# Patient Record
Sex: Male | Born: 1960 | Race: White | Hispanic: No
Health system: Southern US, Community
[De-identification: ages and names within clinical notes are randomized; demographics above are authoritative.]

---

## 2004-12-30 ENCOUNTER — Emergency Department: Payer: Self-pay | Admitting: General Practice

## 2005-03-30 ENCOUNTER — Emergency Department: Payer: Self-pay | Admitting: Emergency Medicine

## 2005-04-03 ENCOUNTER — Emergency Department: Payer: Self-pay | Admitting: Unknown Physician Specialty

## 2005-04-07 ENCOUNTER — Emergency Department: Payer: Self-pay | Admitting: Unknown Physician Specialty

## 2005-04-09 ENCOUNTER — Ambulatory Visit: Payer: Self-pay

## 2005-05-09 ENCOUNTER — Emergency Department: Payer: Self-pay | Admitting: Emergency Medicine

## 2005-08-19 ENCOUNTER — Inpatient Hospital Stay: Payer: Self-pay | Admitting: Urology

## 2005-09-07 ENCOUNTER — Emergency Department: Payer: Self-pay | Admitting: Emergency Medicine

## 2005-09-09 ENCOUNTER — Emergency Department: Payer: Self-pay | Admitting: Emergency Medicine

## 2005-09-19 ENCOUNTER — Emergency Department: Payer: Self-pay | Admitting: Emergency Medicine

## 2005-09-27 ENCOUNTER — Emergency Department: Payer: Self-pay | Admitting: Unknown Physician Specialty

## 2006-01-03 ENCOUNTER — Emergency Department: Payer: Self-pay | Admitting: Emergency Medicine

## 2006-05-02 ENCOUNTER — Emergency Department: Payer: Self-pay | Admitting: Emergency Medicine

## 2007-04-05 ENCOUNTER — Emergency Department: Payer: Self-pay | Admitting: Emergency Medicine

## 2007-05-07 ENCOUNTER — Emergency Department: Payer: Self-pay | Admitting: Emergency Medicine

## 2007-05-28 ENCOUNTER — Emergency Department: Payer: Self-pay | Admitting: Emergency Medicine

## 2007-06-17 ENCOUNTER — Emergency Department: Payer: Self-pay | Admitting: General Practice

## 2007-06-21 ENCOUNTER — Emergency Department: Payer: Self-pay | Admitting: Emergency Medicine

## 2007-07-12 ENCOUNTER — Emergency Department: Payer: Self-pay | Admitting: Emergency Medicine

## 2007-07-17 ENCOUNTER — Emergency Department: Payer: Self-pay | Admitting: Emergency Medicine

## 2007-07-29 ENCOUNTER — Emergency Department: Payer: Self-pay | Admitting: Emergency Medicine

## 2007-07-29 ENCOUNTER — Other Ambulatory Visit: Payer: Self-pay

## 2007-08-08 ENCOUNTER — Emergency Department: Payer: Self-pay | Admitting: Emergency Medicine

## 2007-09-04 ENCOUNTER — Emergency Department: Payer: Self-pay | Admitting: Emergency Medicine

## 2007-09-10 ENCOUNTER — Emergency Department: Payer: Self-pay | Admitting: Emergency Medicine

## 2007-10-01 ENCOUNTER — Emergency Department: Payer: Self-pay

## 2007-10-06 ENCOUNTER — Emergency Department (HOSPITAL_COMMUNITY): Admission: EM | Admit: 2007-10-06 | Discharge: 2007-10-06 | Payer: Self-pay | Admitting: Emergency Medicine

## 2007-10-12 ENCOUNTER — Emergency Department: Payer: Self-pay | Admitting: Internal Medicine

## 2007-10-16 ENCOUNTER — Emergency Department: Payer: Self-pay | Admitting: Emergency Medicine

## 2008-03-10 ENCOUNTER — Emergency Department: Payer: Self-pay | Admitting: Unknown Physician Specialty

## 2008-04-06 ENCOUNTER — Emergency Department: Payer: Self-pay | Admitting: Emergency Medicine

## 2008-05-24 ENCOUNTER — Emergency Department: Payer: Self-pay | Admitting: Emergency Medicine

## 2008-05-24 ENCOUNTER — Other Ambulatory Visit: Payer: Self-pay

## 2008-10-18 IMAGING — CT CT HEAD WITHOUT CONTRAST
2 series · 16 of 30 positions shown, 20 images · non-contrast
Comparison: none

REASON FOR EXAM: ataxia, rm 4
COMMENTS:

PROCEDURE:     CT  - CT HEAD WITHOUT CONTRAST  - July 29, 2007  [DATE]
RESULT:     Comparison: No available comparison exam.
Procedure: CT examination of the head was performed without intravenous
contrast. Collimation is 5 mm.

[Series 2: without · axial · non-contrast · 0.40mm/px · z∈[-97,+33]mm · 13 of 32 slices shown, 17 images]
[im 3/32  brain]
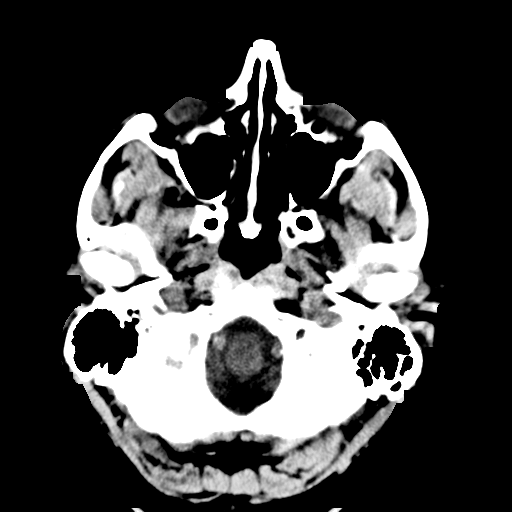
[im 3/32  bone]
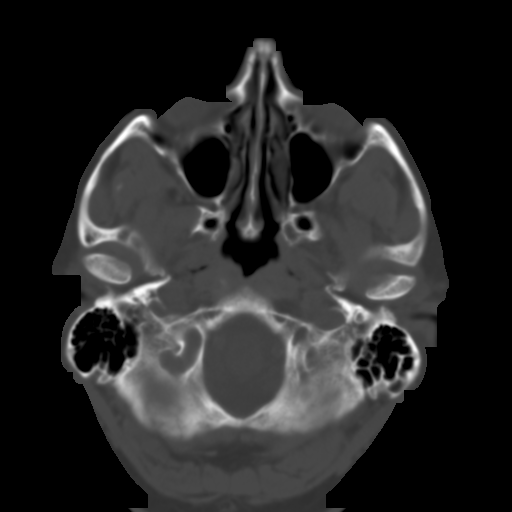
[im 5/32  brain]
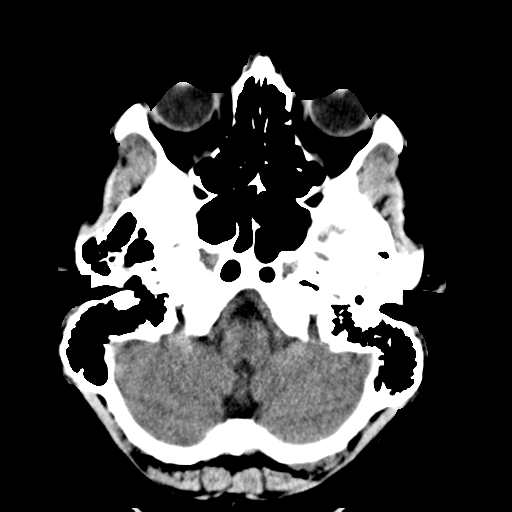
[im 7/32  brain]
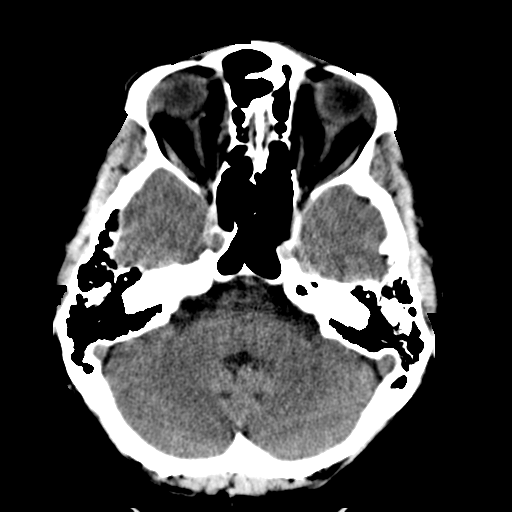
[im 9/32  brain]
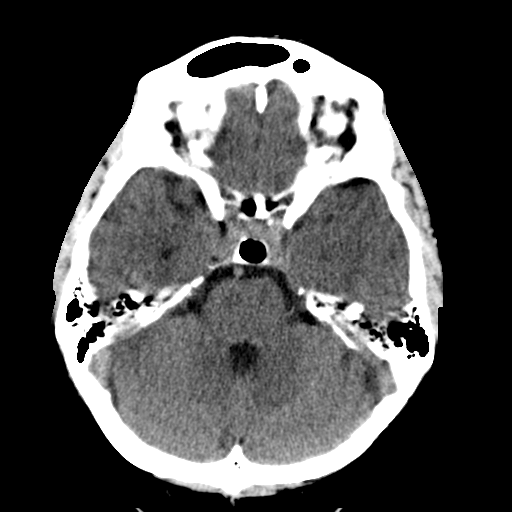
[im 12/32  brain]
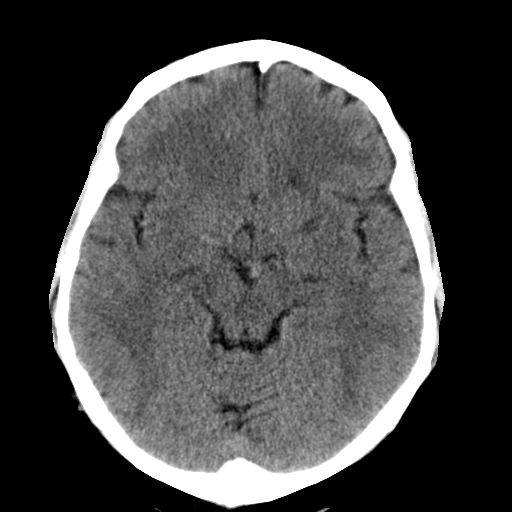
[im 12/32  bone]
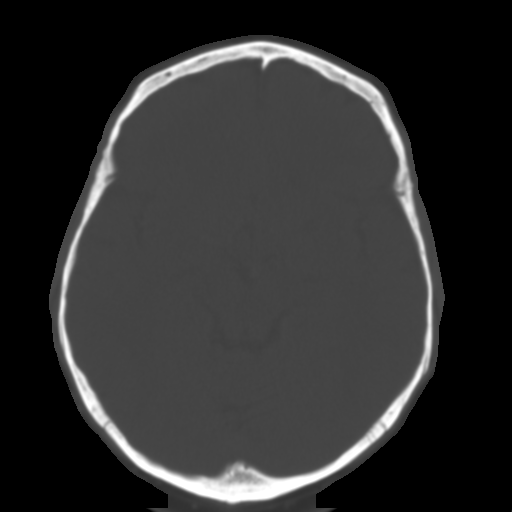
[im 14/32  brain]
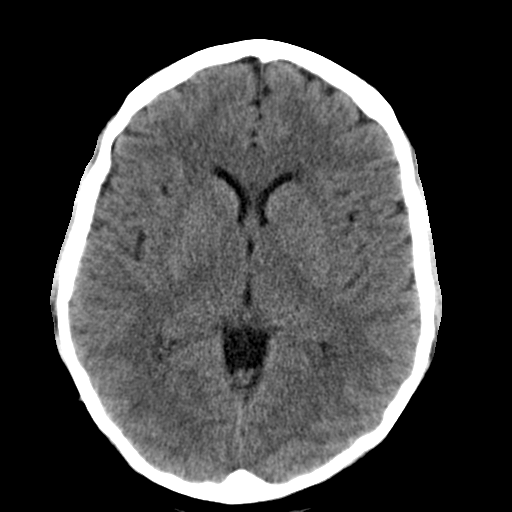
[im 16/32  brain]
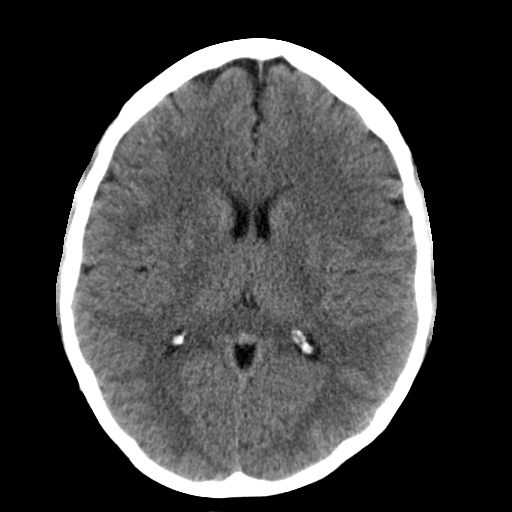
[im 18/32  brain]
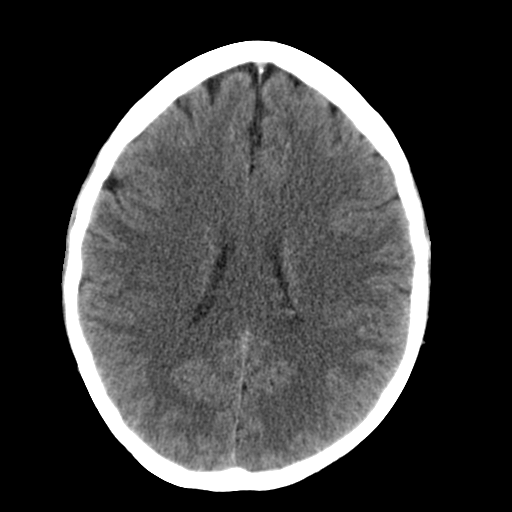
[im 20/32  brain]
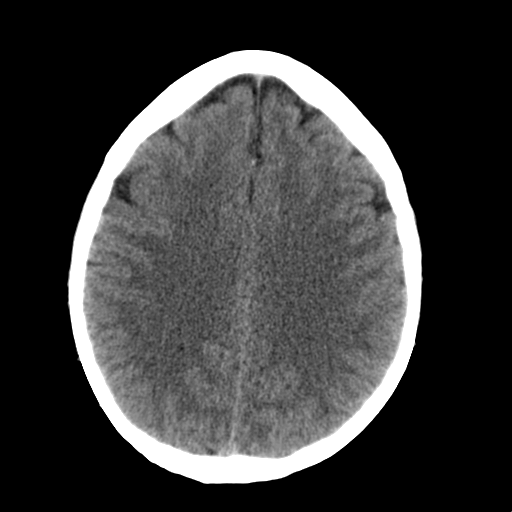
[im 20/32  bone]
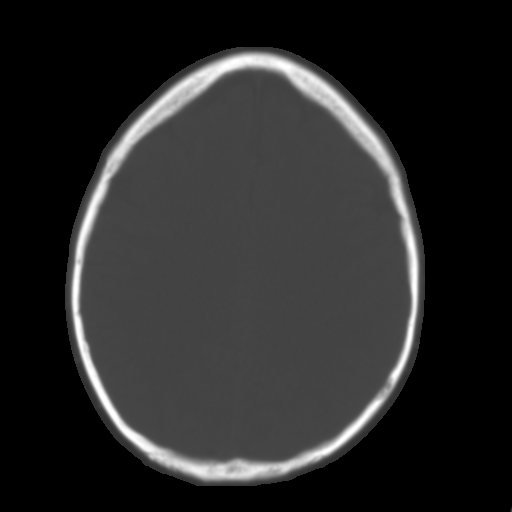
[im 23/32  brain]
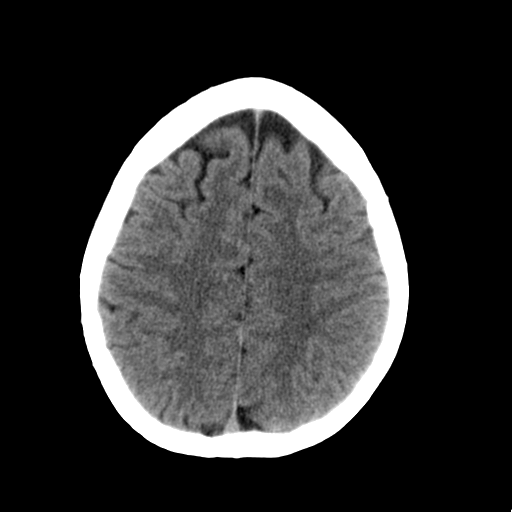
[im 25/32  brain]
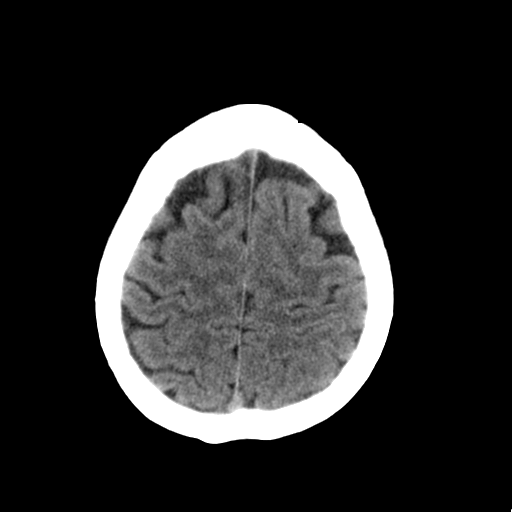
[im 27/32  brain]
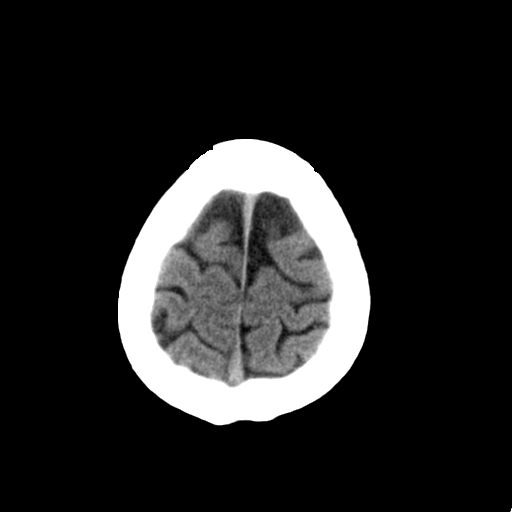
[im 29/32  brain]
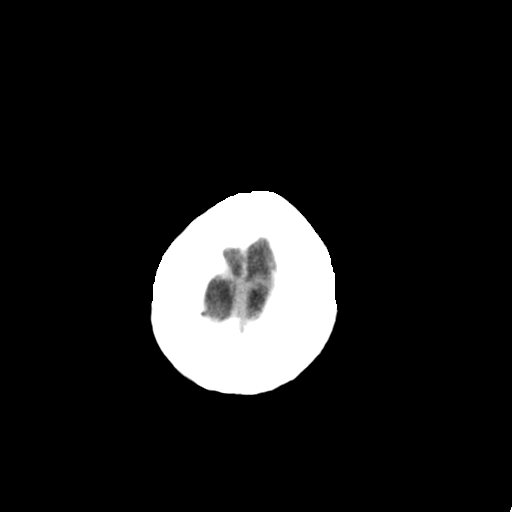
[im 29/32  bone]
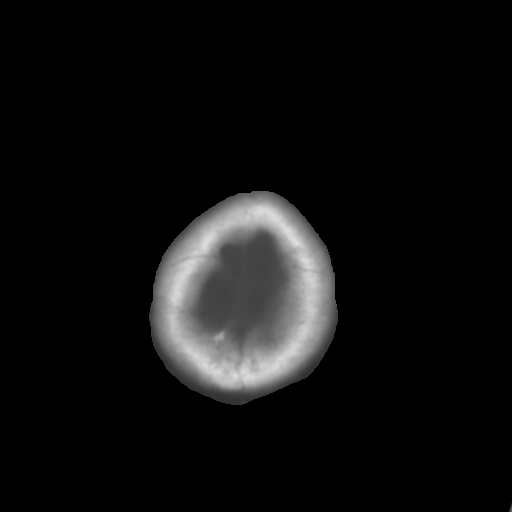

[Series 3: bone · axial · 0.40mm/px · z∈[-97,-52]mm · 3 of 32 slices shown]
[im 3/32  bone]
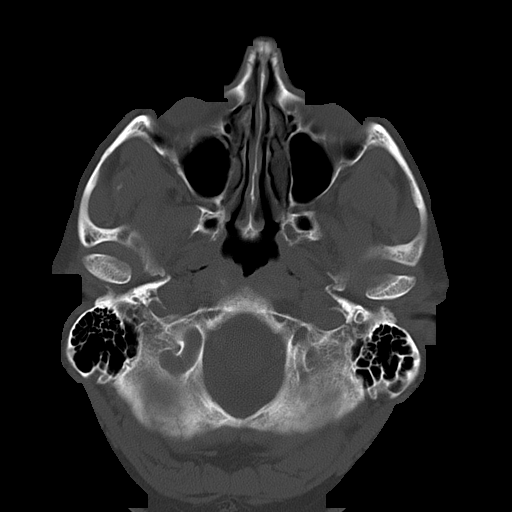
[im 7/32  bone]
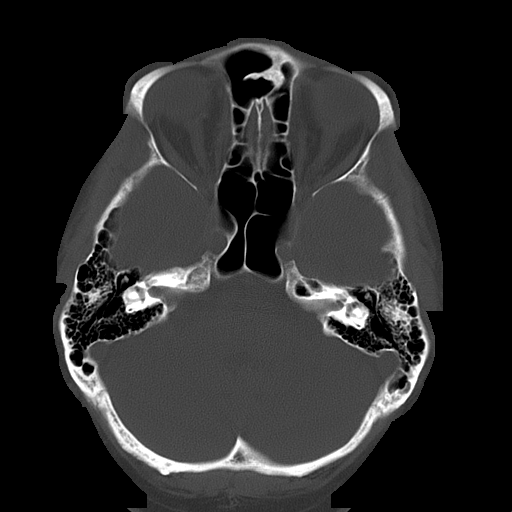
[im 12/32  bone]
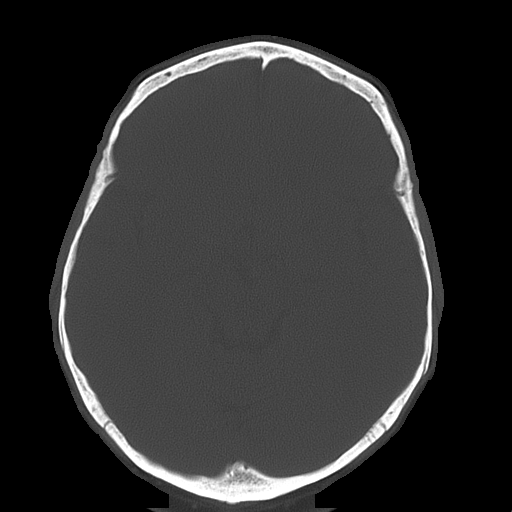

[16 of 30 positions shown; findings below may reference images not displayed]

FINDINGS: No evidence of intracranial hemorrhage, mass-effect, or ventricular
dilatation. Minimal left in cerebral white matter low attenuations are
nonspecific, but are most commonly due to minimal small vessel disease. No
displaced calvarial fracture is noted. The visualized paranasal sinuses and
mastoid air cells are unremarkable.
IMPRESSION: 1. Minimal left in cerebral white matter low attenuations are nonspecific,
but are most commonly due to minimal small vessel disease.

## 2010-11-22 ENCOUNTER — Inpatient Hospital Stay: Payer: Self-pay | Admitting: Urology

## 2010-12-20 ENCOUNTER — Emergency Department: Payer: Self-pay | Admitting: Emergency Medicine

## 2010-12-24 ENCOUNTER — Emergency Department: Payer: Self-pay | Admitting: Emergency Medicine

## 2010-12-30 ENCOUNTER — Ambulatory Visit: Payer: Self-pay | Admitting: Specialist

## 2011-03-09 ENCOUNTER — Inpatient Hospital Stay: Payer: Self-pay | Admitting: Unknown Physician Specialty

## 2011-05-02 ENCOUNTER — Inpatient Hospital Stay: Payer: Self-pay | Admitting: Internal Medicine

## 2011-05-05 ENCOUNTER — Inpatient Hospital Stay: Payer: Self-pay | Admitting: Unknown Physician Specialty

## 2011-05-28 ENCOUNTER — Inpatient Hospital Stay: Payer: Self-pay | Admitting: Vascular Surgery

## 2011-05-30 ENCOUNTER — Inpatient Hospital Stay: Payer: Self-pay | Admitting: Psychiatry

## 2011-07-06 ENCOUNTER — Emergency Department: Payer: Self-pay | Admitting: Unknown Physician Specialty

## 2011-08-07 ENCOUNTER — Inpatient Hospital Stay: Payer: Self-pay | Admitting: Psychiatry

## 2011-11-11 ENCOUNTER — Emergency Department: Payer: Self-pay | Admitting: *Deleted

## 2012-01-18 LAB — CBC
HCT: 46.3 % (ref 40.0–52.0)
MCH: 31.4 pg (ref 26.0–34.0)
MCHC: 34.6 g/dL (ref 32.0–36.0)
MCV: 91 fL (ref 80–100)
Platelet: 127 10*3/uL — ABNORMAL LOW (ref 150–440)
RDW: 13.5 % (ref 11.5–14.5)
WBC: 5.8 10*3/uL (ref 3.8–10.6)

## 2012-01-18 LAB — CK TOTAL AND CKMB (NOT AT ARMC)
CK, Total: 59 U/L (ref 35–232)
CK, Total: 66 U/L (ref 35–232)
CK-MB: <0.5 ng/mL — ABNORMAL LOW (ref 0.5–3.6)
CK-MB: <0.5 ng/mL — ABNORMAL LOW (ref 0.5–3.6)

## 2012-01-18 LAB — BASIC METABOLIC PANEL
Calcium, Total: 9.1 mg/dL (ref 8.5–10.1)
Chloride: 101 mmol/L (ref 98–107)
Creatinine: 1.85 mg/dL — ABNORMAL HIGH (ref 0.60–1.30)
EGFR (African American): 50 — ABNORMAL LOW
EGFR (Non-African Amer.): 41 — ABNORMAL LOW
Glucose: 106 mg/dL — ABNORMAL HIGH (ref 65–99)
Osmolality: 275 (ref 275–301)

## 2012-01-18 LAB — TROPONIN I
Troponin-I: <0.02 ng/mL
Troponin-I: <0.02 ng/mL

## 2012-01-19 ENCOUNTER — Inpatient Hospital Stay: Payer: Self-pay | Admitting: Family Medicine

## 2012-01-19 LAB — BASIC METABOLIC PANEL
Anion Gap: 8 (ref 7–16)
BUN: 21 mg/dL — ABNORMAL HIGH (ref 7–18)
Chloride: 103 mmol/L (ref 98–107)
Creatinine: 1.58 mg/dL — ABNORMAL HIGH (ref 0.60–1.30)
EGFR (Non-African Amer.): 49 — ABNORMAL LOW
Osmolality: 279 (ref 275–301)

## 2012-01-19 LAB — CBC WITH DIFFERENTIAL/PLATELET
Basophil %: 0.8 %
Eosinophil #: 0.1 10*3/uL (ref 0.0–0.7)
HGB: 13.3 g/dL (ref 13.0–18.0)
MCH: 30.9 pg (ref 26.0–34.0)
MCHC: 34.1 g/dL (ref 32.0–36.0)
Monocyte #: 0.3 10*3/uL (ref 0.0–0.7)
Neutrophil #: 2.1 10*3/uL (ref 1.4–6.5)
Neutrophil %: 52.5 %

## 2012-01-19 LAB — CK TOTAL AND CKMB (NOT AT ARMC): CK-MB: <0.5 ng/mL — ABNORMAL LOW (ref 0.5–3.6)

## 2012-01-20 LAB — BASIC METABOLIC PANEL
Anion Gap: 8 (ref 7–16)
BUN: 12 mg/dL (ref 7–18)
Calcium, Total: 8.8 mg/dL (ref 8.5–10.1)
Co2: 28 mmol/L (ref 21–32)
EGFR (African American): >60
EGFR (Non-African Amer.): 58 — ABNORMAL LOW
Sodium: 140 mmol/L (ref 136–145)

## 2012-02-08 ENCOUNTER — Emergency Department: Payer: Self-pay | Admitting: Emergency Medicine

## 2012-02-08 LAB — DRUG SCREEN, URINE
Amphetamines, Ur Screen: NEGATIVE (ref ?–1000)
Barbiturates, Ur Screen: NEGATIVE (ref ?–200)
Benzodiazepine, Ur Scrn: NEGATIVE (ref ?–200)
Cannabinoid 50 Ng, Ur ~~LOC~~: POSITIVE (ref ?–50)
Cocaine Metabolite,Ur ~~LOC~~: NEGATIVE (ref ?–300)
MDMA (Ecstasy)Ur Screen: POSITIVE (ref ?–500)
Methadone, Ur Screen: NEGATIVE (ref ?–300)
Opiate, Ur Screen: NEGATIVE (ref ?–300)
Tricyclic, Ur Screen: POSITIVE (ref ?–1000)

## 2012-02-08 LAB — URINALYSIS, COMPLETE
Bacteria: NONE SEEN
Leukocyte Esterase: NEGATIVE
Nitrite: NEGATIVE
Ph: 6 (ref 4.5–8.0)
Protein: NEGATIVE
Specific Gravity: 1.012 (ref 1.003–1.030)
WBC UR: 1 /HPF (ref 0–5)

## 2012-02-08 LAB — COMPREHENSIVE METABOLIC PANEL
Albumin: 4.7 g/dL (ref 3.4–5.0)
Alkaline Phosphatase: 93 U/L (ref 50–136)
BUN: 12 mg/dL (ref 7–18)
Bilirubin,Total: 0.6 mg/dL (ref 0.2–1.0)
Chloride: 102 mmol/L (ref 98–107)
Co2: 25 mmol/L (ref 21–32)
EGFR (African American): >60
EGFR (Non-African Amer.): 52 — ABNORMAL LOW
Glucose: 111 mg/dL — ABNORMAL HIGH (ref 65–99)
SGOT(AST): 12 U/L — ABNORMAL LOW (ref 15–37)
SGPT (ALT): 17 U/L
Sodium: 133 mmol/L — ABNORMAL LOW (ref 136–145)
Total Protein: 8.5 g/dL — ABNORMAL HIGH (ref 6.4–8.2)

## 2012-02-08 LAB — TSH: Thyroid Stimulating Horm: 1.93 u[IU]/mL

## 2012-02-08 LAB — CBC
HGB: 16.2 g/dL (ref 13.0–18.0)
MCH: 31 pg (ref 26.0–34.0)
MCHC: 34.2 g/dL (ref 32.0–36.0)
RDW: 14.1 % (ref 11.5–14.5)

## 2012-02-08 LAB — ETHANOL: Ethanol %: <0.003 % (ref 0.000–0.080)

## 2012-02-08 LAB — SALICYLATE LEVEL: Salicylates, Serum: 5.3 mg/dL — ABNORMAL HIGH

## 2012-02-08 LAB — ACETAMINOPHEN LEVEL: Acetaminophen: <2 ug/mL

## 2012-05-18 LAB — COMPREHENSIVE METABOLIC PANEL
Anion Gap: 9 (ref 7–16)
Bilirubin,Total: 0.8 mg/dL (ref 0.2–1.0)
Calcium, Total: 9 mg/dL (ref 8.5–10.1)
Chloride: 100 mmol/L (ref 98–107)
EGFR (African American): >60
Osmolality: 271 (ref 275–301)
Potassium: 3.8 mmol/L (ref 3.5–5.1)
Sodium: 133 mmol/L — ABNORMAL LOW (ref 136–145)
Total Protein: 8.1 g/dL (ref 6.4–8.2)

## 2012-05-18 LAB — URINALYSIS, COMPLETE
Glucose,UR: NEGATIVE mg/dL (ref 0–75)
Protein: 30
RBC,UR: 5 /HPF (ref 0–5)
Specific Gravity: 1.013 (ref 1.003–1.030)
Squamous Epithelial: 1

## 2012-05-18 LAB — CBC
MCH: 30.8 pg (ref 26.0–34.0)
MCHC: 35 g/dL (ref 32.0–36.0)
MCV: 88 fL (ref 80–100)
RDW: 14 % (ref 11.5–14.5)

## 2012-05-18 LAB — DRUG SCREEN, URINE
Amphetamines, Ur Screen: NEGATIVE (ref ?–1000)
Benzodiazepine, Ur Scrn: NEGATIVE (ref ?–200)
MDMA (Ecstasy)Ur Screen: POSITIVE (ref ?–500)
Methadone, Ur Screen: NEGATIVE (ref ?–300)
Opiate, Ur Screen: NEGATIVE (ref ?–300)
Phencyclidine (PCP) Ur S: NEGATIVE (ref ?–25)

## 2012-05-18 LAB — ETHANOL
Ethanol %: <0.003 % (ref 0.000–0.080)
Ethanol: <3 mg/dL

## 2012-05-18 LAB — SALICYLATE LEVEL: Salicylates, Serum: 4.2 mg/dL — ABNORMAL HIGH

## 2012-05-18 LAB — ACETAMINOPHEN LEVEL: Acetaminophen: <2 ug/mL

## 2012-05-19 ENCOUNTER — Inpatient Hospital Stay: Payer: Self-pay | Admitting: Psychiatry

## 2012-05-26 LAB — POTASSIUM: Potassium: 3.8 mmol/L (ref 3.5–5.1)

## 2013-01-31 IMAGING — CR DG SHOULDER 3+V*R*
1 series · 3 of 3 positions shown · non-contrast
Comparison: none

REASON FOR EXAM: shoulder  pain
COMMENTS:

PROCEDURE:     DXR - DXR SHOULDER RIGHT COMPLETE  - November 11, 2011 [DATE]
RESULT:     No fracture, dislocation or other acute bony abnormality is
identified. A metallic surgical anchor is again visualized over the right
humeral head and is unchanged in appearance as compared to the prior exam.

[Series 1: w shoulder external right · 0.14mm/px · 3 of 3 slices shown]
[im 1/3]
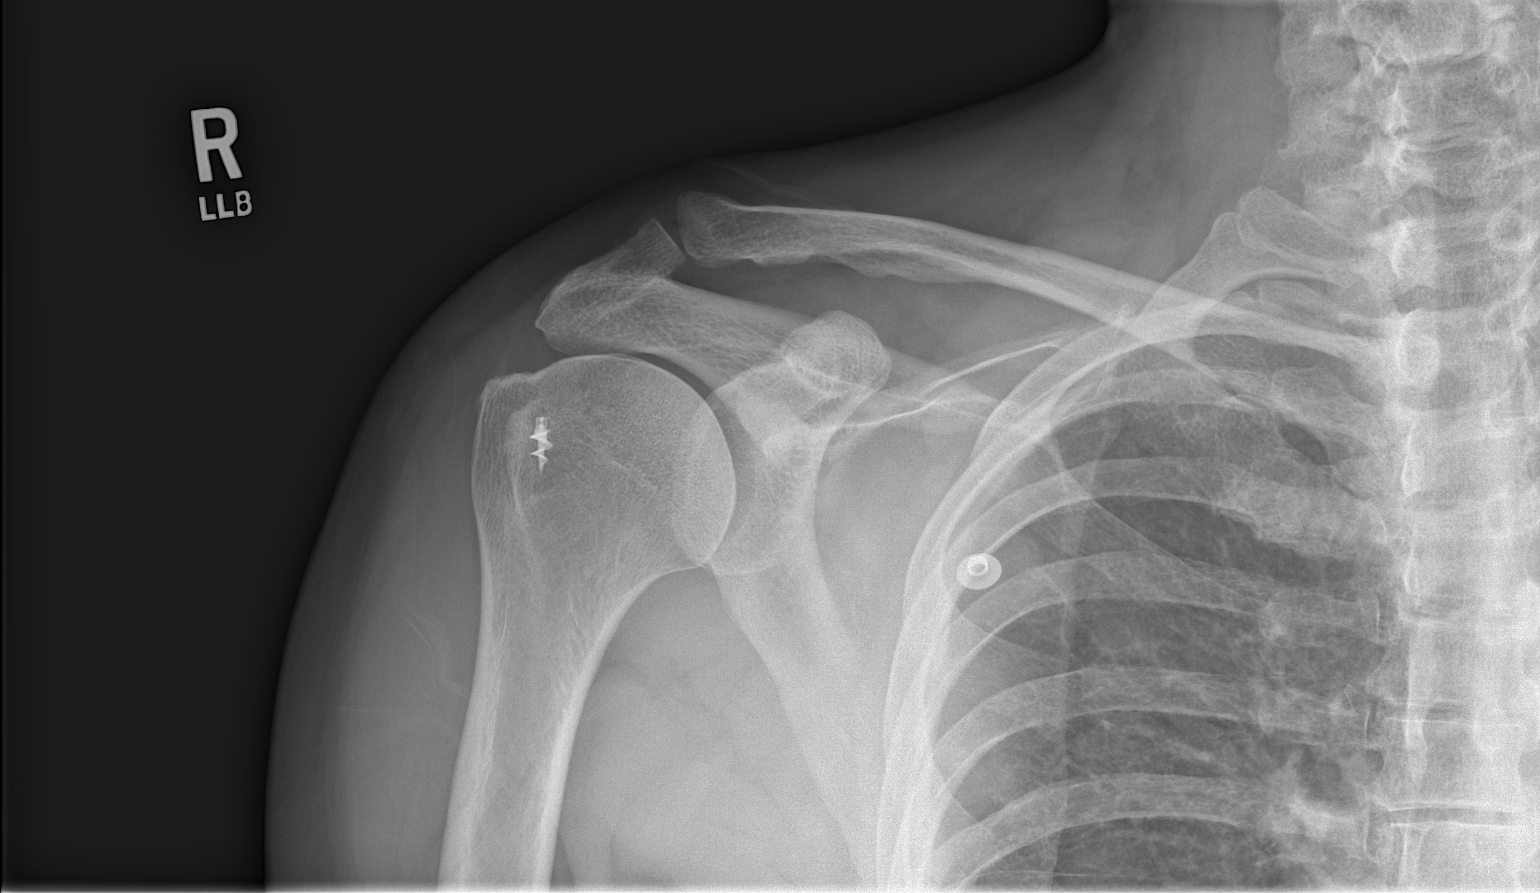
[im 2/3]
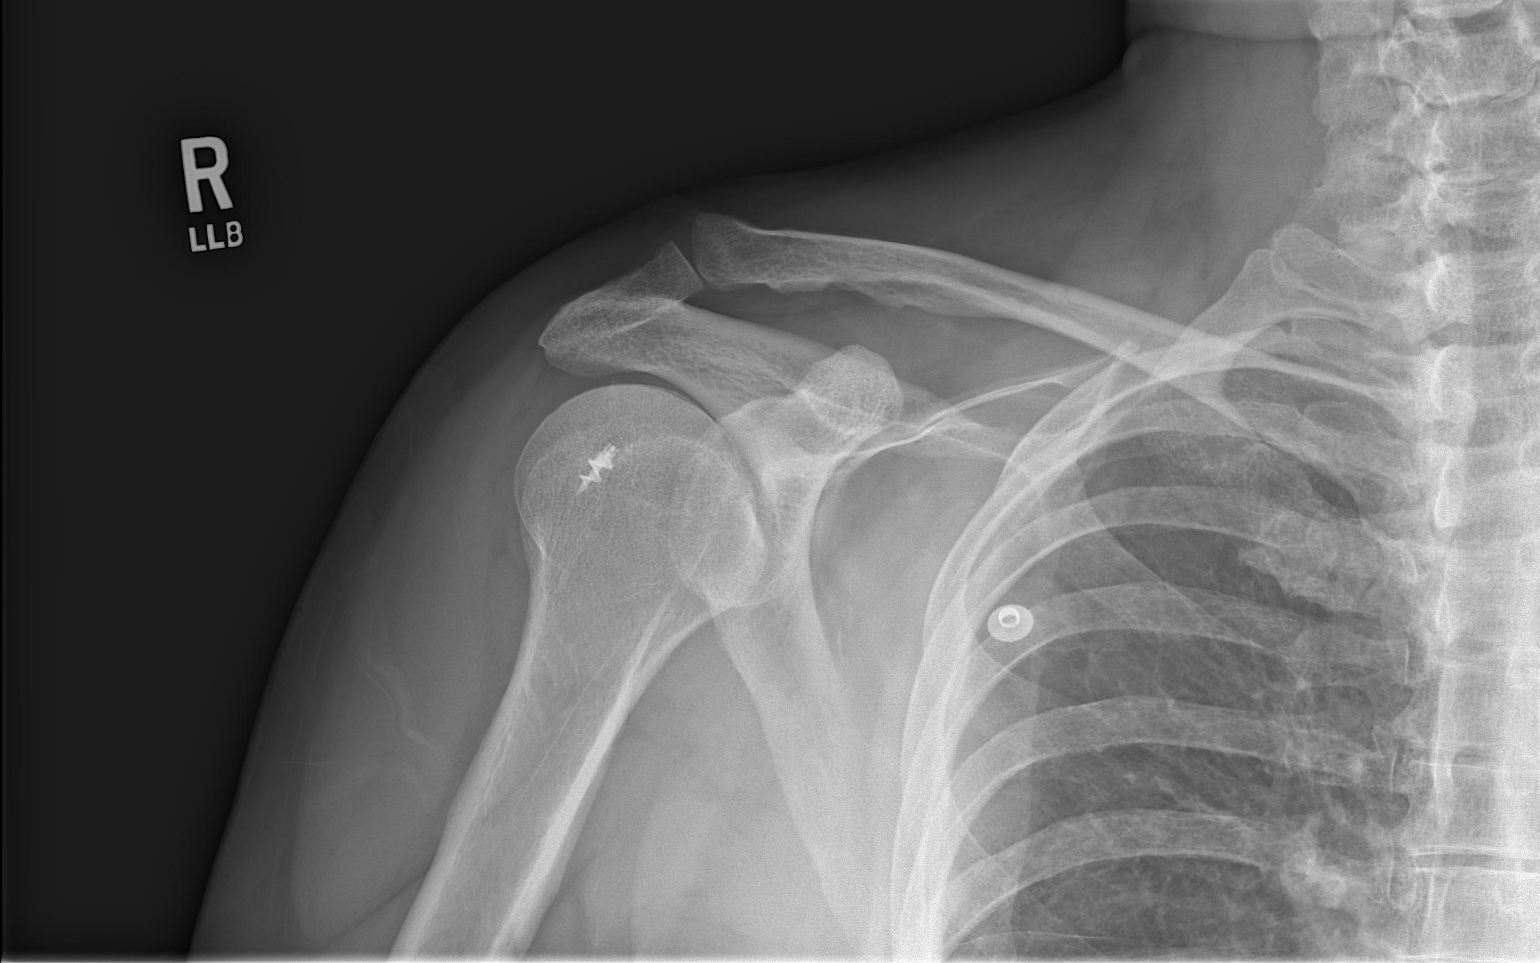
[im 3/3]
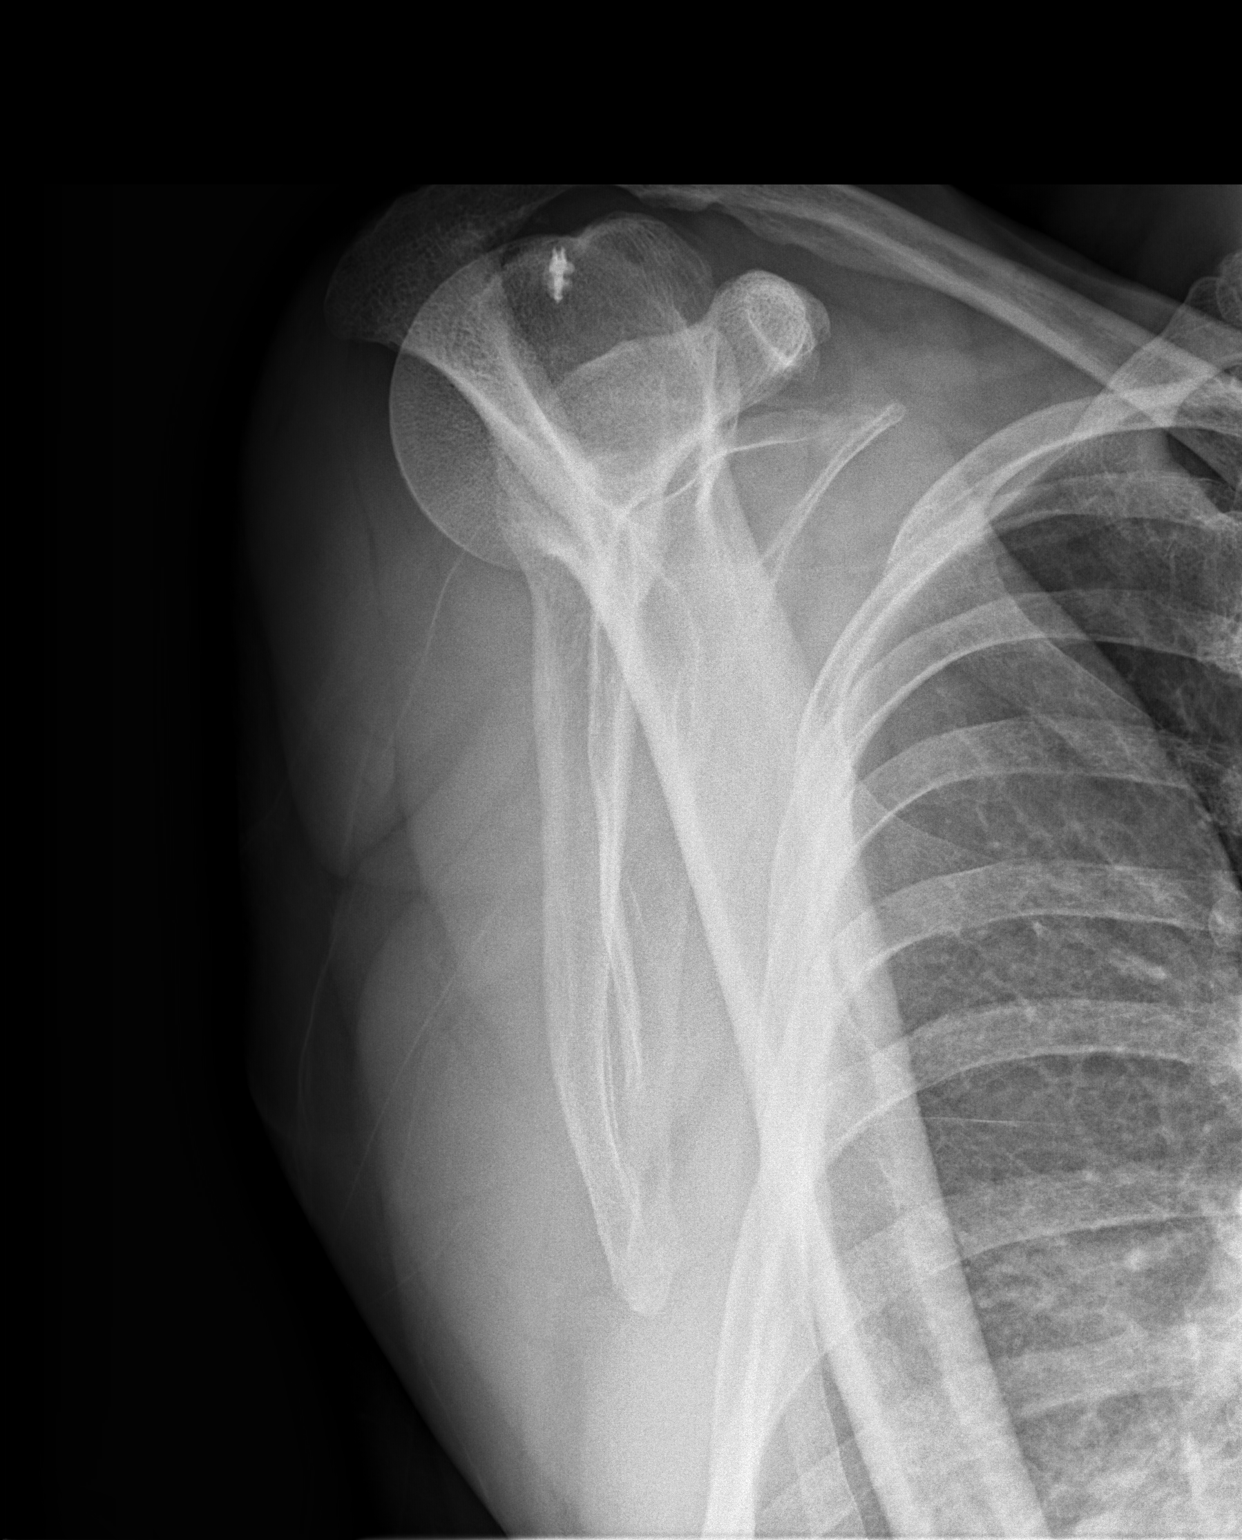

[3 of 3 positions shown; findings below may reference images not displayed]

IMPRESSION: No acute bony abnormalities are identified.

## 2015-04-14 NOTE — Discharge Summary (Signed)
PATIENT NAME:  Douglas Glover MR#:  [id number] DATE OF BIRTH:  [birth year]  DATE OF ADMISSION:  05/19/2012 DATE OF DISCHARGE:  05/31/2012  HISTORY OF PRESENT ILLNESS: [name] is Glover 54 year old male admitted to the [facility] with auditory hallucinations telling him to kill himself. He had various plans including using knives or crashing his car. He had gone through Glover several week period without his psychotropic medication and had only restarted them five days prior to admission.   He also had experienced Glover return of low energy, difficulty concentrating, and anhedonia. His memory and orientation function were intact.   ANCILLARY CLINICAL DATA: [name] was given his dosages of amlodipine and hydrochlorothiazide, however, his blood pressure continued to be high. He then remembered that he had been on atenolol. The atenolol was restarted and titrated to 50 mg daily with resolution of his hypertension.   He also has Glover history of lower extremity edema. He was given knee high socks bilaterally which had been prescribed for him as an outpatient by his primary care physician.   HOSPITAL COURSE: [name] was admitted to the [facility] and underwent milieu and group psychotherapy. He underwent Glover titration of his Seroquel back to 900 mg at bedtime for anti-psychosis and mood stabilization. He also underwent Glover restarting of his Remeron at 15 mg at bedtime along with Trazodone 400 mg at bedtime.   His mood progressively improved. His auditory hallucinations eventually resolved.   CONDITION ON DISCHARGE: By June 11th, [name] is socially appropriate. His mood is normal. His interests are normal. He has constructive future goals and interests. He is not having any auditory hallucinations. He is not showing any adverse medication effect.   MENTAL STATUS EXAM UPON DISCHARGE: [name] is alert. His eye contact is good. Concentration is normal. His memory is intact  to immediate, recent, and remote. His fund of knowledge, intelligence, and use of language are normal. His speech involves normal rate and prosody without dysarthria. Thought process is logical, coherent, and goal directed. No looseness of associations. Thought content no thoughts of harming himself or others. No delusions or hallucinations. Insight is intact. Judgment intact. Affect broad and appropriate. Mood within normal limits.   ASSESSMENT:  AXIS I:  1. Schizoaffective disorder, recent episode depressed with psychosis now in clinical remission.  2. Cannabis abuse.   AXIS II: Deferred.   AXIS III:  1. Lumbar disk disease. 2. Chronic back pain. 3. Hypertension. 4. Rule out lower extremity venous insufficiency.   AXIS IV: Primary support group.   AXIS V: 55.   [name] does not have any risk of harming himself or others. He agrees to call emergency services immediately for any thoughts of harming himself, thoughts of harming others, or distress.   DIET: Regular.   ACTIVITY: Routine.   FOLLOW-UP: He will make Glover follow-up appointment with his primary care [facility] within 10 days of discharge. He also has Glover psychiatric follow-up with [facility] on June 13th at 9 Glover.m.    DISCHARGE MEDICATIONS:  1. Doxepin 25 mg at bedtime, #10.  2. Remeron 15 mg at bedtime, #10.   3. Gabapentin 300 mg t.i.d., #30.  4. Hydrochlorothiazide 12.5 mg daily, #10.   5. Trazodone 100 mg 4 p.o. at bedtime, #40.   6. Tramadol 50 mg q.6 hours p.r.n. #20.   7. Norvasc 10 mg daily, #10.   8. Bisacodyl 10 mg at bedtime, #10.  9.  Quetiapine 100 mg 9 p.o. at bedtime, #45, refill x1.  10. Hydroxyzine 25 mg 1 to 2 t.i.d. p.r.n. anxiety and 1 to 3 p.o. at bedtime p.r.n. insomnia. 11. Atenolol 50 mg daily, #10.   ____________________________ Douglas Amas. Kmya Placide, MD jsw:drc D: 05/31/2012 20:49:20 ET T: 06/01/2012 06:14:25 ET JOB#: [id number]  cc: Douglas Amas. Douglas Strohm, MD, <Dictator> Douglas [place] MD ELECTRONICALLY SIGNED 06/04/2012 10:24

## 2015-04-14 NOTE — H&P (Signed)
PATIENT NAME:  Douglas Glover, Douglas Glover MR#:  [id number] DATE OF BIRTH:  07/10/1961  DATE OF ADMISSION:  01/18/2012  REFERRING PHYSICIAN: Dr. Carollee Massed   FAMILY PHYSICIAN: Dr. Burnadette Pop   REASON FOR ADMISSION: Recurrent chest pain with near syncope.   HISTORY OF PRESENT ILLNESS: The patient is a 54 year old male with a history of insignificant coronary artery disease by recent cardiac catheterization as well as benign hypertension, chronic pain, and history of psychosis who presents to the Emergency Room with recurrent episodes of chest pain as well as recurrent episodes of near syncope when he stands up or walks. In the Emergency Room, initial enzymes were negative and his EKG was unremarkable. CT was negative for any type of aneurysmal aortic issues. Blood pressure was low in the Emergency Room. He attributes his symptoms to recent change in medications. He is now admitted for further evaluation.   PAST MEDICAL HISTORY:  1. Major depression with multiple suicide attempts.  2. Insignificant coronary artery disease by cardiac catheterization. 3. Benign hypertension.  4. Migraine headaches. 5. Nephrolithiasis.  6. Chronic back pain.  7. Degenerative disk disease.  8. History of psychosis.  9. Chronic insomnia.   MEDICATIONS:  1. Atenolol 100 mg p.o. daily.  2. Lisinopril 20 mg p.o. daily.  3. Hydrochlorothiazide 25 mg p.o. daily.  4. Trazodone 400 mg p.o. at bedtime.  5. Seroquel 1000 mg p.o. daily.   ALLERGIES: Tetracycline, Toradol, and Ambien.   SOCIAL HISTORY: The patient is divorced and lives by himself. Smokes 3 to 4 cigarettes per day. Has a history of alcohol abuse but none recently.   FAMILY HISTORY: Unknown. The patient is adopted.   REVIEW OF SYSTEMS: As per history of present illness.   PHYSICAL EXAMINATION:   GENERAL: The patient is in no acute distress.   VITAL SIGNS: Vital signs are currently remarkable for a blood pressure of 98/62 with a heart rate of 74 and a  respiratory rate of 18. He is afebrile.   HEENT: Normocephalic, atraumatic. Pupils equally round and reactive to light and accommodation. Extraocular movements are intact. Sclerae are nonicteric. Conjunctivae are clear. Oropharynx is clear.   NECK: Supple without JVD or bruits. No adenopathy or thyromegaly is noted.   LUNGS: Clear to auscultation and percussion without wheezes, rales, or rhonchi. No dullness.   CARDIAC: Regular rate and rhythm with normal S1 and S2. No significant rubs, murmurs, or gallops.   ABDOMEN: Soft, nontender with normoactive bowel sounds. No organomegaly or masses were appreciated. No hernias or bruits were noted.   EXTREMITIES: Without clubbing, cyanosis, or edema. Pulses were 2+ bilaterally.   SKIN: Warm and dry without rash or lesions.   NEUROLOGIC: Cranial nerves II through XII grossly intact. Deep tendon reflexes were symmetric. Motor and sensory exam is nonfocal.   PSYCH: Alert and oriented to person, place, and time. He was cooperative and used good judgment.   LABORATORY DATA: EKG was normal sinus rhythm with no acute ischemic changes. CT of the chest was unremarkable. Troponin was less than 0.02. White count was 5.8 with a hemoglobin of 16.0. Glucose 106 with a BUN of 25 and a creatinine of 1.85 with sodium of 135 and a GFR of 41.   ASSESSMENT:  1. Near syncope associated with hypotension.  2. Dehydration with renal insufficiency.  3. Atypical chest pain.  4. Insignificant coronary artery disease by cardiac catheterization. 5. Chronic depression.  6. Chronic pain syndrome.   PLAN:  1. The patient will be observed on telemetry  with aspirin and Lovenox.  2. Will follow serial cardiac enzymes.  3. Will hold his lisinopril and hydrochlorothiazide.  4. Will continue his atenolol and resume his Norvasc at his request.  5. Will hydrate with IV normal saline and follow-up labs in the morning.  6. Will obtain carotid Doppler's and an echocardiogram  because of his near syncope.  7. Further treatment and evaluation will depend upon the patient's progress.   TOTAL TIME SPENT ON THIS PATIENT: 45 minutes.   ____________________________ Duane LopeJeffrey D. Judithann SheenSparks, MD jds:drc D: 01/18/2012 19:06:41 ET T: 01/19/2012 05:41:11 ET JOB#: [id number]  cc: Duane LopeJeffrey D. Judithann SheenSparks, MD, <Dictator> Marisue IvanKanhka Linthavong, MD Lanorris Kalisz Rodena Medin Ethon Wymer MD ELECTRONICALLY SIGNED 01/19/2012 11:27

## 2015-04-14 NOTE — H&P (Signed)
PATIENT NAME:  Douglas Glover, Douglas Glover DATE OF BIRTH:  1961/04/21  DATE OF ADMISSION:  05/18/2012  CHIEF COMPLAINT AND IDENTIFYING DATA: Douglas Glover is a 54 year old male presenting to the Emergency Department with depression, suicidal ideation, and auditory hallucinations.   The auditory hallucinations are telling him to kill himself. He has a plan to use knives and he has also had a plan to crash his car. He stopped his medications and only restarted them five days prior to presentation.   He has also been experiencing some visual hallucinations where he sees spirits. He has been experiencing a return of depressed mood, low energy, difficulty concentrating, and anhedonia. Psychosocial efforts to improve his symptoms have not worked.   His orientation and memory function are intact. He is not combative.   PAST PSYCHIATRIC HISTORY: Douglas Glover does have a history of several psychiatric admissions. He was recently admitted to [facility]. He has attempted suicide over five times including overdoses as well as one instance in which he tried to use gas in a room. He does have a history of repeated episodes of suppression with psychotic features.   He has been tried on multiple psychotropic medication including Geodon, Abilify, and Zyprexa. Most recently he was stabilized on high dose Seroquel combined with trazodone, Remeron, and a low dose of Doxepin. However, he stopped the medication and had a return of symptoms.   FAMILY PSYCHIATRIC HISTORY: None known.   SOCIAL HISTORY: Douglas Glover has been residing at home with his girlfriend. 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 He has three children and three grandchildren. When he is not depressed, he enjoys TV and movies.   OCCUPATION: Disabled on SSI. He has a history of several  years of illegal drug use including cocaine, marijuana, and Quaaludes. He also drank heavily. At that time he stopped all of that illegal drug use and alcohol use in 1994 except for occasional marijuana use.   PAST MEDICAL HISTORY:  1. Gastroesophageal reflux disease.  2. Usual childhood illnesses. 3. Severe lumbar disk disease.  4. Hypertension.  5. History of kidney stone. 6. Vasectomy. 7. Coronary artery disease.   ALLERGIES: Ambien, tetracycline, Toradol   MEDICATIONS: 1. Seroquel 900 mg at bedtime. 2. Trazodone 400 mg at bedtime. 3. Remeron 15 mg at bedtime. 4. Doxepin 25 mg at bedtime.   LABORATORY DATA: Urine drug screen positive for cannabinoids. Creatinine 1.44. TSH, aspirin, CBC, ethanol, and Tylenol all unremarkable.   REVIEW OF SYSTEMS: Constitutional, HEENT, mouth, neurologic, psychiatric, cardiovascular, respiratory, gastrointestinal, genitourinary, skin, musculoskeletal, hematologic, lymphatic, endocrine, metabolic all unremarkable.   PHYSICAL EXAMINATION:   VITAL SIGNS: Temperature 96.1, pulse 81, respiratory rate 18, blood pressure 128/79.   GENERAL APPEARANCE: Douglas Glover is a well developed, well nourished, middle-aged male with no abnormal involuntary movements. He has no cachexia. His muscle tone is normal. He is well groomed. His hygiene is normal.   HEENT: Normocephalic atraumatic. Pupils equally round and reactive to light and accommodation. Oropharynx clear without erythema.   EXTREMITIES: No cyanosis, clubbing, or edema.  SKIN: Normal turgor. No rashes. There are tattoos distributed evenly over his upper extremities.   NECK: Supple, nontender. No masses.   RESPIRATORY: Clear to auscultation. No wheezing, rhonchi, or rales.   CARDIOVASCULAR: Regular rate and rhythm without  murmurs, rubs, or gallops.   ABDOMEN: Nondistended. Bowel sounds positive. Soft, nontender, no masses.   GENITOURINARY: Deferred.   NEUROLOGIC: Cranial nerves II through XII  intact. General sensory intact throughout to light touch. Motor 5/5 throughout. Deep tendon reflexes normal strength and symmetry throughout. No Babinski. Coordination intact by finger-to-nose bilaterally and normal gait.   MENTAL STATUS EXAMINATION: Douglas Glover is alert. His eye contact is good. His concentration is mildly decreased. He is oriented to all spheres. His memory is intact to immediate, recent, and remote. His fund of knowledge, use of language and intelligence are normal. His speech is slightly slowed. He has normal prosody. No dysarthria. Thought process is logical, coherent, and goal directed. No looseness of associations or tangents. Thought content is the same as in the history of present illness. Insight is marginal. Judgment is impaired. Affect constricted. Mood depressed.   ASSESSMENT: AXIS I:  1. Major depressive disorder, recurrent, with psychotic features. 2. Cannabis abuse.   AXIS II: Deferred.  AXIS III: 1. Lumbar disk disease.  2. Chronic pain. 3. See the past medical history.   AXIS IV: General medical.  AXIS V: 30.   Douglas Glover does have a history of suicide attempts with high validity risk. He has command self-destructive auditory hallucinations and has a number of plans in mind. However, he has enough insight to understand that he is ill and he wants treatment.   PLAN:  1. Douglas Glover will be admitted to the Inpatient [facility]al [facility] Unit due to his risk of suicide as well as his impaired judgment and risk of self-neglect.  2. He will undergo milieu and group psychotherapy.  3. Will proceed with the restarting of his previous effective regimen with Seroquel starting at 200 mg at bedtime for antipsychosis, Remeron started at 15 mg at bedtime for anti-depression, Doxepin at 25 mg at bedtime for anti-pain mainly as well as some anti-depression augmentation, and Trazodone at 200 mg to start for anti-depression augmentation with  Remeron.  ____________________________ Adelene Amas. Hailly Fess, MD jsw:drc D: 05/18/2012 23:39:23 ET T: 05/19/2012 05:58:39 ET JOB#: [id number]  cc: Adelene Amas. Gricelda Foland, MD, <Dictator> Lester Wheeler MD ELECTRONICALLY SIGNED 05/19/2012 17:17

## 2015-04-14 NOTE — Discharge Summary (Signed)
PATIENT NAME:  Douglas CortiDUYGOU, [name] DATE OF BIRTH:  Dec 11, 1961  DATE OF ADMISSION:  01/19/2012 DATE OF DISCHARGE:  01/20/2012  DISCHARGE DIAGNOSES:  1. Near syncope.  2. Chest discomfort.  3. Psychosis, not otherwise specified. 4. Right shoulder pain. 5. Insomnia.   6. Hypertension.   DISCHARGE MEDICATIONS:  1. Atenolol 100 mg p.o. daily.  2. Seroquel 200 mg tabs 5 tabs p.o. daily.  3. Trazodone 100 mg tabs 4 tabs p.o. at bedtime. This is all per psychiatry.  4. Amlodipine 5 mg p.o. daily.  5. Ultram 50 mg p.o. t.i.d. p.r.n. for pain.   NOTE: Patient instructed to stop the lisinopril, hydrochlorothiazide.   CONSULTS: None.   PROCEDURES: None.   LABORATORY, DIAGNOSTIC AND RADIOLOGICAL DATA: Pertinent labs on day of discharge: Sodium 140, potassium 4.3, BUN 12, creatinine 1.37. Normal echocardiogram with ejection fraction greater than 55%. CT of the chest did convey coronary artery disease. Chest x-ray was negative. Carotid ultrasound was negative. Cardiac enzymes also negative.   BRIEF HOSPITAL COURSE: This is a 54 year old male who was initially admitted with near syncopal episodes and chest discomfort.  1. Near syncopal episodes. It seemed to be orthostatic related as dizziness seemed to occur when he would get up and ambulate which did improve after changes to his blood pressure medications.  2. Chest discomfort. Patient had normal EKG and normal cardiac enzymes. Echocardiogram was within normal limits. His CT of the chest did show coronary artery disease. No further issues during his hospitalization. Will get [facility] with [facility]ology as an outpatient.  3. Hypertension. Patient's blood pressure remained stable during his hospital course. He was switched over from lisinopril hydrochlorothiazide to amlodipine 5 mg daily and continued with his atenolol 100 mg daily. Will stay on this course for now.   4. Right shoulder pain, unchanged. He has got a history of  rotator cuff surgery. Will treat his pain with Tylenol and Ultram. Will have him follow up with orthopedics as an outpatient.  5. Psychosis, not otherwise specified. He is on a very unusual psychotic regimen. He has appointment to establish care this Friday and will defer management of those medications to psychiatry.   DISPOSITION: He is in stable condition to be discharged to home.   FOLLOW UP: Follow up with me next week in the office as scheduled.  ____________________________ Douglas IvanKanhka Huy Majid, MD kl:cms D: 01/20/2012 08:15:42 ET T: 01/20/2012 10:12:26 ET JOB#: [id number]  cc: Douglas IvanKanhka Terriona Horlacher, MD, <Dictator> Douglas IvanKANHKA Brennyn Ortlieb MD ELECTRONICALLY SIGNED 02/04/2012 7:31
